# Patient Record
Sex: Female | Born: 1970 | Race: Black or African American | Hispanic: No | Marital: Married | Smoking: Never smoker
Health system: Southern US, Community
[De-identification: ages and names within clinical notes are randomized; demographics above are authoritative.]

---

## 2001-04-02 ENCOUNTER — Other Ambulatory Visit: Admission: RE | Admit: 2001-04-02 | Discharge: 2001-04-02 | Payer: Self-pay | Admitting: *Deleted

## 2001-04-03 ENCOUNTER — Other Ambulatory Visit: Admission: RE | Admit: 2001-04-03 | Discharge: 2001-04-03 | Payer: Self-pay | Admitting: *Deleted

## 2002-04-08 ENCOUNTER — Other Ambulatory Visit: Admission: RE | Admit: 2002-04-08 | Discharge: 2002-04-08 | Payer: Self-pay | Admitting: Obstetrics and Gynecology

## 2003-04-10 ENCOUNTER — Other Ambulatory Visit: Admission: RE | Admit: 2003-04-10 | Discharge: 2003-04-10 | Payer: Self-pay | Admitting: Obstetrics and Gynecology

## 2003-04-14 ENCOUNTER — Encounter: Admission: RE | Admit: 2003-04-14 | Discharge: 2003-04-14 | Payer: Self-pay | Admitting: Obstetrics and Gynecology

## 2003-09-19 ENCOUNTER — Ambulatory Visit (HOSPITAL_COMMUNITY): Admission: RE | Admit: 2003-09-19 | Discharge: 2003-09-19 | Payer: Self-pay | Admitting: Obstetrics and Gynecology

## 2003-11-15 ENCOUNTER — Inpatient Hospital Stay (HOSPITAL_COMMUNITY): Admission: AD | Admit: 2003-11-15 | Discharge: 2003-11-15 | Payer: Self-pay | Admitting: Obstetrics and Gynecology

## 2004-02-12 ENCOUNTER — Inpatient Hospital Stay (HOSPITAL_COMMUNITY): Admission: AD | Admit: 2004-02-12 | Discharge: 2004-02-14 | Payer: Self-pay | Admitting: Obstetrics and Gynecology

## 2004-05-03 ENCOUNTER — Other Ambulatory Visit: Admission: RE | Admit: 2004-05-03 | Discharge: 2004-05-03 | Payer: Self-pay | Admitting: Obstetrics and Gynecology

## 2005-07-14 ENCOUNTER — Other Ambulatory Visit: Admission: RE | Admit: 2005-07-14 | Discharge: 2005-07-14 | Payer: Self-pay | Admitting: Obstetrics and Gynecology

## 2009-01-27 ENCOUNTER — Encounter: Admission: RE | Admit: 2009-01-27 | Discharge: 2009-01-27 | Payer: Self-pay | Admitting: Obstetrics and Gynecology

## 2009-02-04 ENCOUNTER — Encounter: Admission: RE | Admit: 2009-02-04 | Discharge: 2009-02-04 | Payer: Self-pay | Admitting: Obstetrics and Gynecology

## 2010-02-16 ENCOUNTER — Other Ambulatory Visit: Payer: Self-pay | Admitting: Obstetrics and Gynecology

## 2010-02-23 ENCOUNTER — Ambulatory Visit
Admission: RE | Admit: 2010-02-23 | Discharge: 2010-02-23 | Disposition: A | Discharge status: Home / Self Care | Payer: BC Managed Care – PPO | Source: Ambulatory Visit | Attending: Obstetrics and Gynecology | Admitting: Obstetrics and Gynecology

## 2010-02-23 ENCOUNTER — Other Ambulatory Visit: Payer: Self-pay | Admitting: Obstetrics and Gynecology

## 2010-05-21 NOTE — H&P (Signed)
NAMEARRON, Payne                ACCOUNT NO.:  [id number]   MEDICAL RECORD NO.:  [id number]          PATIENT TYPE:  MAT   LOCATION:  [facility]                          FACILITY:  [facility]   PHYSICIAN:  Naima A. Dillard, M.D. DATE OF BIRTH:  [birth year]   DATE OF ADMISSION:  02/12/2004  DATE OF DISCHARGE:                                HISTORY & PHYSICAL   HISTORY OF PRESENT ILLNESS:  Ms. Gabriela Payne is a 40 year old single black female,  gravida 3, para 0-0-2-0, at 40 weeks, who presents with regular uterine  contractions for two hours.  She denies leaking, bleeding, headache, nausea  and vomiting, or visual disturbances.  Her pregnancy has  been followed by  the [facility] certified nurse midwife service and has been  remarkable for:  1) Advanced maternal age.  2) Questionable last menstrual  period.  3) First trimester spotting.  4) Latex allergy.  5) Polydactyly.  6) HSV 2.  7) Group B Strep positive.  The patient denies any HSV prodrome.  Her prenatal labs were collected on July 08, 2003; hemoglobin 12.3,  hematocrit 38.4, platelets 284,000. Blood type A positive, antibody  negative, sickle cell trait negative, RPR nonreactive, rubella immune,  hepatitis B surface antigen negative, HIV nonreactive, Pap smear within  normal limits, gonorrhea negative, Chlamydia negative, HSV positive.  Her  one-hour Glucola from November 13, 2003, was 99 and hemoglobin at that time  was 11.5.  Culture of the vaginal tract for Group B Strep on January 16, 2004, was positive.   HISTORY OF PRESENT PREGNANCY:  The patient presented for care at [facility] on July 08, 2003, at 8-1/[redacted] weeks gestation.  EDC at that time was  established by ultrasound as February 12, 2004.  The patient declined genetic  screening.  Pregnancy ultrasonography at [redacted] weeks gestation shows growth  consistent with previous dating, no anatomic abnormalities were noted.  Rest  of her prenatal care has been unremarkable.   PAST  OBSTETRICAL HISTORY:  She is a gravida 3, para 0-0-2-0.  In 1985, she  had a first trimester SAB with a D&C.  In 1989 she had a first trimester  EAB.   ALLERGIES:  She has a questionable allergy to COMPAZINE resulting in  swelling of the tongue.  She also has a LATEX allergy resulting in swelling.   PAST MEDICAL HISTORY:  She reports having had the usual childhood illnesses.  She was treated in 1988 for Chlamydia.  She was diagnosed with HSV 2  approximately two years ago.  She reports having a heart murmur not  requiring prophylaxis.  She had anemia as a child.   PAST SURGICAL HISTORY:  Negative.   FAMILY HISTORY:  Remarkable for maternal grandmother with chronic  hypertension.  Mother and sister with arthritis.  Maternal grandmother with  colon cancer.  Father with multiple addictions.   GENETIC HISTORY:  Remarkable for the patient at age 48 and also polydactyly.   SOCIAL HISTORY:  Father of the baby is involved and supportive.  His name is  Gabriela Payne.  They are of the  [employer] faith.  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  Father of the baby is a Psychiatric nurse.  They deny any  alcohol, tobacco, or illicit drug use with the pregnancy.   PHYSICAL EXAMINATION:  VITAL SIGNS:  Stable, she is afebrile.  HEENT:  Grossly within normal limits.  CHEST:  Clear to auscultation.  HEART:  Regular rate and rhythm.  ABDOMEN:  Gravid in contour, fundal height extending approximately 40 cm  above pubic symphysis.  Fetal heart rate is reactive and reassuring.  Contractions every three minutes.  PELVIC:  Cervix is 4 cm, 90%, and vertex at -2 with intact bag of waters.  Speculum examination shows no signs or symptoms of HSV lesions.  EXTREMITIES:  Normal.   ASSESSMENT:  1.  Intrauterine pregnancy at term.  2.  Early active labor.   PLAN:  1.  To admit to birthing suite.  Dr. Normand Sloop has been notified.  2.  Routine C.N.M. orders.  3.  Plan Penicillin-G for Group B Strep.  4.  The  patient plans epidural for labor.      KS/MEDQ  D:  02/12/2004  T:  02/12/2004  Job:  [id number]

## 2011-04-29 ENCOUNTER — Other Ambulatory Visit: Payer: Self-pay | Admitting: Obstetrics and Gynecology

## 2011-04-29 DIAGNOSIS — Z1231 Encounter for screening mammogram for malignant neoplasm of breast: Secondary | ICD-10-CM

## 2011-05-11 ENCOUNTER — Ambulatory Visit
Admission: RE | Admit: 2011-05-11 | Discharge: 2011-05-11 | Disposition: A | Discharge status: Home / Self Care | Payer: BC Managed Care – PPO | Source: Ambulatory Visit | Attending: Obstetrics and Gynecology | Admitting: Obstetrics and Gynecology

## 2011-05-11 DIAGNOSIS — Z1231 Encounter for screening mammogram for malignant neoplasm of breast: Secondary | ICD-10-CM

## 2011-06-07 ENCOUNTER — Encounter: Payer: Self-pay | Admitting: Obstetrics and Gynecology

## 2012-10-12 ENCOUNTER — Other Ambulatory Visit: Payer: Self-pay

## 2012-10-12 DIAGNOSIS — Z1231 Encounter for screening mammogram for malignant neoplasm of breast: Secondary | ICD-10-CM

## 2012-10-30 ENCOUNTER — Ambulatory Visit
Admission: RE | Admit: 2012-10-30 | Discharge: 2012-10-30 | Disposition: A | Discharge status: Home / Self Care | Payer: BC Managed Care – PPO | Source: Ambulatory Visit

## 2012-10-30 DIAGNOSIS — Z1231 Encounter for screening mammogram for malignant neoplasm of breast: Secondary | ICD-10-CM

## 2013-10-21 ENCOUNTER — Other Ambulatory Visit: Payer: Self-pay

## 2013-10-21 DIAGNOSIS — Z1239 Encounter for other screening for malignant neoplasm of breast: Secondary | ICD-10-CM

## 2013-10-24 ENCOUNTER — Other Ambulatory Visit: Payer: Self-pay

## 2013-10-24 DIAGNOSIS — Z1231 Encounter for screening mammogram for malignant neoplasm of breast: Secondary | ICD-10-CM

## 2013-11-01 ENCOUNTER — Ambulatory Visit
Admission: RE | Admit: 2013-11-01 | Discharge: 2013-11-01 | Disposition: A | Discharge status: Home / Self Care | Payer: BC Managed Care – PPO | Source: Ambulatory Visit

## 2013-11-01 DIAGNOSIS — Z1231 Encounter for screening mammogram for malignant neoplasm of breast: Secondary | ICD-10-CM

## 2013-11-05 ENCOUNTER — Other Ambulatory Visit: Payer: Self-pay | Admitting: Obstetrics and Gynecology

## 2013-11-05 DIAGNOSIS — R928 Other abnormal and inconclusive findings on diagnostic imaging of breast: Secondary | ICD-10-CM

## 2013-11-18 ENCOUNTER — Ambulatory Visit
Admission: RE | Admit: 2013-11-18 | Discharge: 2013-11-18 | Disposition: A | Discharge status: Home / Self Care | Payer: BC Managed Care – PPO | Source: Ambulatory Visit | Attending: Obstetrics and Gynecology | Admitting: Obstetrics and Gynecology

## 2013-11-18 DIAGNOSIS — R928 Other abnormal and inconclusive findings on diagnostic imaging of breast: Secondary | ICD-10-CM

## 2015-02-03 ENCOUNTER — Other Ambulatory Visit: Payer: Self-pay

## 2015-02-03 DIAGNOSIS — Z1231 Encounter for screening mammogram for malignant neoplasm of breast: Secondary | ICD-10-CM

## 2015-02-13 ENCOUNTER — Ambulatory Visit
Admission: RE | Admit: 2015-02-13 | Discharge: 2015-02-13 | Disposition: A | Discharge status: Home / Self Care | Payer: BC Managed Care – PPO | Source: Ambulatory Visit

## 2015-02-13 DIAGNOSIS — Z1231 Encounter for screening mammogram for malignant neoplasm of breast: Secondary | ICD-10-CM

## 2015-02-17 ENCOUNTER — Other Ambulatory Visit: Payer: Self-pay | Admitting: Obstetrics and Gynecology

## 2015-02-17 DIAGNOSIS — R928 Other abnormal and inconclusive findings on diagnostic imaging of breast: Secondary | ICD-10-CM

## 2015-02-23 ENCOUNTER — Ambulatory Visit
Admission: RE | Admit: 2015-02-23 | Discharge: 2015-02-23 | Disposition: A | Discharge status: Home / Self Care | Payer: BC Managed Care – PPO | Source: Ambulatory Visit | Attending: Obstetrics and Gynecology | Admitting: Obstetrics and Gynecology

## 2015-02-23 DIAGNOSIS — R928 Other abnormal and inconclusive findings on diagnostic imaging of breast: Secondary | ICD-10-CM

## 2016-04-13 ENCOUNTER — Other Ambulatory Visit: Payer: Self-pay | Admitting: Obstetrics and Gynecology

## 2016-04-13 DIAGNOSIS — Z1231 Encounter for screening mammogram for malignant neoplasm of breast: Secondary | ICD-10-CM

## 2016-05-04 ENCOUNTER — Ambulatory Visit
Admission: RE | Admit: 2016-05-04 | Discharge: 2016-05-04 | Disposition: A | Discharge status: Home / Self Care | Payer: BC Managed Care – PPO | Source: Ambulatory Visit | Attending: Obstetrics and Gynecology | Admitting: Obstetrics and Gynecology

## 2016-05-04 DIAGNOSIS — Z1231 Encounter for screening mammogram for malignant neoplasm of breast: Secondary | ICD-10-CM

## 2016-05-05 ENCOUNTER — Other Ambulatory Visit: Payer: Self-pay | Admitting: Obstetrics and Gynecology

## 2016-05-05 DIAGNOSIS — R928 Other abnormal and inconclusive findings on diagnostic imaging of breast: Secondary | ICD-10-CM

## 2016-05-09 ENCOUNTER — Ambulatory Visit
Admission: RE | Admit: 2016-05-09 | Discharge: 2016-05-09 | Disposition: A | Discharge status: Home / Self Care | Payer: BC Managed Care – PPO | Source: Ambulatory Visit | Attending: Obstetrics and Gynecology | Admitting: Obstetrics and Gynecology

## 2016-05-09 DIAGNOSIS — R928 Other abnormal and inconclusive findings on diagnostic imaging of breast: Secondary | ICD-10-CM

## 2017-08-10 ENCOUNTER — Other Ambulatory Visit: Payer: Self-pay | Admitting: Obstetrics and Gynecology

## 2017-08-10 DIAGNOSIS — Z1231 Encounter for screening mammogram for malignant neoplasm of breast: Secondary | ICD-10-CM

## 2017-09-05 ENCOUNTER — Ambulatory Visit
Admission: RE | Admit: 2017-09-05 | Discharge: 2017-09-05 | Disposition: A | Discharge status: Home / Self Care | Payer: BC Managed Care – PPO | Source: Ambulatory Visit | Attending: Obstetrics and Gynecology | Admitting: Obstetrics and Gynecology

## 2017-09-05 DIAGNOSIS — Z1231 Encounter for screening mammogram for malignant neoplasm of breast: Secondary | ICD-10-CM

## 2017-09-06 ENCOUNTER — Other Ambulatory Visit: Payer: Self-pay | Admitting: Obstetrics and Gynecology

## 2017-09-06 DIAGNOSIS — R928 Other abnormal and inconclusive findings on diagnostic imaging of breast: Secondary | ICD-10-CM

## 2017-09-13 ENCOUNTER — Ambulatory Visit
Admission: RE | Admit: 2017-09-13 | Discharge: 2017-09-13 | Disposition: A | Discharge status: Home / Self Care | Payer: BC Managed Care – PPO | Source: Ambulatory Visit | Attending: Obstetrics and Gynecology | Admitting: Obstetrics and Gynecology

## 2017-09-13 DIAGNOSIS — R928 Other abnormal and inconclusive findings on diagnostic imaging of breast: Secondary | ICD-10-CM

## 2018-09-21 ENCOUNTER — Other Ambulatory Visit: Payer: Self-pay | Admitting: Obstetrics and Gynecology

## 2018-09-21 DIAGNOSIS — Z1231 Encounter for screening mammogram for malignant neoplasm of breast: Secondary | ICD-10-CM

## 2018-11-09 ENCOUNTER — Other Ambulatory Visit: Payer: Self-pay

## 2018-11-09 ENCOUNTER — Ambulatory Visit
Admission: RE | Admit: 2018-11-09 | Discharge: 2018-11-09 | Disposition: A | Discharge status: Home / Self Care | Payer: BC Managed Care – PPO | Source: Ambulatory Visit | Attending: Obstetrics and Gynecology | Admitting: Obstetrics and Gynecology

## 2018-11-09 DIAGNOSIS — Z1231 Encounter for screening mammogram for malignant neoplasm of breast: Secondary | ICD-10-CM

## 2019-11-05 ENCOUNTER — Other Ambulatory Visit: Payer: Self-pay | Admitting: Family Medicine

## 2019-11-05 DIAGNOSIS — Z1231 Encounter for screening mammogram for malignant neoplasm of breast: Secondary | ICD-10-CM

## 2019-12-03 ENCOUNTER — Ambulatory Visit
Admission: RE | Admit: 2019-12-03 | Discharge: 2019-12-03 | Disposition: A | Discharge status: Home / Self Care | Payer: BC Managed Care – PPO | Source: Ambulatory Visit | Attending: Family Medicine | Admitting: Family Medicine

## 2019-12-03 ENCOUNTER — Other Ambulatory Visit: Payer: Self-pay

## 2019-12-03 DIAGNOSIS — Z1231 Encounter for screening mammogram for malignant neoplasm of breast: Secondary | ICD-10-CM

## 2019-12-13 ENCOUNTER — Ambulatory Visit: Payer: BC Managed Care – PPO

## 2020-11-20 ENCOUNTER — Other Ambulatory Visit: Payer: Self-pay | Admitting: Family Medicine

## 2020-11-20 DIAGNOSIS — Z1231 Encounter for screening mammogram for malignant neoplasm of breast: Secondary | ICD-10-CM

## 2020-12-22 ENCOUNTER — Ambulatory Visit
Admission: RE | Admit: 2020-12-22 | Discharge: 2020-12-22 | Disposition: A | Discharge status: Home / Self Care | Payer: BC Managed Care – PPO | Source: Ambulatory Visit | Attending: Family Medicine | Admitting: Family Medicine

## 2020-12-22 DIAGNOSIS — Z1231 Encounter for screening mammogram for malignant neoplasm of breast: Secondary | ICD-10-CM

## 2020-12-23 ENCOUNTER — Ambulatory Visit: Payer: BC Managed Care – PPO

## 2021-08-24 ENCOUNTER — Other Ambulatory Visit: Payer: Self-pay

## 2021-08-24 ENCOUNTER — Ambulatory Visit: Payer: BC Managed Care – PPO | Attending: Family Medicine | Admitting: Physical Therapy

## 2021-08-24 ENCOUNTER — Encounter: Payer: Self-pay | Admitting: Physical Therapy

## 2021-08-24 DIAGNOSIS — M6281 Muscle weakness (generalized): Secondary | ICD-10-CM | POA: Insufficient documentation

## 2021-08-24 DIAGNOSIS — R252 Cramp and spasm: Secondary | ICD-10-CM | POA: Diagnosis present
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
## 2021-08-31 ENCOUNTER — Encounter: Payer: Self-pay | Admitting: Physical Therapy

## 2021-08-31 ENCOUNTER — Ambulatory Visit: Payer: BC Managed Care – PPO | Admitting: Physical Therapy

## 2021-08-31 DIAGNOSIS — M6281 Muscle weakness (generalized): Secondary | ICD-10-CM

## 2021-08-31 DIAGNOSIS — R252 Cramp and spasm: Secondary | ICD-10-CM
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
## 2021-09-08 ENCOUNTER — Encounter: Payer: Self-pay | Admitting: Physical Therapy

## 2021-09-08 ENCOUNTER — Ambulatory Visit: Payer: BC Managed Care – PPO | Attending: Family Medicine | Admitting: Physical Therapy

## 2021-09-08 DIAGNOSIS — M6281 Muscle weakness (generalized): Secondary | ICD-10-CM | POA: Diagnosis present

## 2021-09-08 DIAGNOSIS — R252 Cramp and spasm: Secondary | ICD-10-CM | POA: Diagnosis present
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
## 2021-09-16 ENCOUNTER — Ambulatory Visit: Payer: BC Managed Care – PPO | Admitting: Physical Therapy

## 2021-09-16 ENCOUNTER — Encounter: Payer: Self-pay | Admitting: Physical Therapy

## 2021-09-16 DIAGNOSIS — M6281 Muscle weakness (generalized): Secondary | ICD-10-CM | POA: Diagnosis not present

## 2021-09-16 DIAGNOSIS — R252 Cramp and spasm: Secondary | ICD-10-CM
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
## 2021-09-23 ENCOUNTER — Ambulatory Visit: Payer: BC Managed Care – PPO | Admitting: Physical Therapy

## 2021-09-23 ENCOUNTER — Encounter: Payer: Self-pay | Admitting: Physical Therapy

## 2021-09-23 DIAGNOSIS — M6281 Muscle weakness (generalized): Secondary | ICD-10-CM

## 2021-09-23 DIAGNOSIS — R252 Cramp and spasm: Secondary | ICD-10-CM

## 2021-09-28 ENCOUNTER — Ambulatory Visit: Payer: BC Managed Care – PPO | Admitting: Physical Therapy

## 2021-10-05 ENCOUNTER — Encounter: Payer: BC Managed Care – PPO | Admitting: Physical Therapy

## 2021-10-20 ENCOUNTER — Ambulatory Visit: Payer: BC Managed Care – PPO | Attending: Family Medicine | Admitting: Physical Therapy

## 2021-10-20 DIAGNOSIS — R252 Cramp and spasm: Secondary | ICD-10-CM | POA: Insufficient documentation

## 2021-10-20 DIAGNOSIS — M6281 Muscle weakness (generalized): Secondary | ICD-10-CM | POA: Insufficient documentation
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
## 2021-10-28 ENCOUNTER — Encounter: Payer: Self-pay | Admitting: Physical Therapy

## 2021-10-28 ENCOUNTER — Ambulatory Visit: Payer: BC Managed Care – PPO | Admitting: Physical Therapy

## 2021-10-28 DIAGNOSIS — M6281 Muscle weakness (generalized): Secondary | ICD-10-CM | POA: Diagnosis not present

## 2021-10-28 DIAGNOSIS — R252 Cramp and spasm: Secondary | ICD-10-CM
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
## 2021-11-04 ENCOUNTER — Ambulatory Visit: Payer: BC Managed Care – PPO | Attending: Family Medicine | Admitting: Physical Therapy

## 2021-11-04 DIAGNOSIS — M6281 Muscle weakness (generalized): Secondary | ICD-10-CM | POA: Diagnosis not present

## 2021-11-04 DIAGNOSIS — R252 Cramp and spasm: Secondary | ICD-10-CM | POA: Diagnosis present
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
## 2021-11-11 ENCOUNTER — Ambulatory Visit: Payer: BC Managed Care – PPO | Admitting: Physical Therapy

## 2021-11-11 ENCOUNTER — Encounter: Payer: Self-pay | Admitting: Physical Therapy

## 2021-11-11 DIAGNOSIS — M6281 Muscle weakness (generalized): Secondary | ICD-10-CM | POA: Diagnosis not present

## 2021-11-11 DIAGNOSIS — R252 Cramp and spasm: Secondary | ICD-10-CM
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
## 2021-11-16 ENCOUNTER — Ambulatory Visit: Payer: BC Managed Care – PPO | Admitting: Physical Therapy

## 2021-11-16 ENCOUNTER — Encounter: Payer: Self-pay | Admitting: Physical Therapy

## 2021-11-16 DIAGNOSIS — M6281 Muscle weakness (generalized): Secondary | ICD-10-CM

## 2021-11-16 DIAGNOSIS — R252 Cramp and spasm: Secondary | ICD-10-CM
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
## 2022-01-14 ENCOUNTER — Other Ambulatory Visit: Payer: Self-pay | Admitting: Obstetrics and Gynecology

## 2022-01-14 DIAGNOSIS — Z1231 Encounter for screening mammogram for malignant neoplasm of breast: Secondary | ICD-10-CM

## 2022-01-18 ENCOUNTER — Ambulatory Visit
Admission: RE | Admit: 2022-01-18 | Discharge: 2022-01-18 | Disposition: A | Discharge status: Home / Self Care | Payer: BC Managed Care – PPO | Source: Ambulatory Visit | Attending: Obstetrics and Gynecology | Admitting: Obstetrics and Gynecology

## 2022-01-18 DIAGNOSIS — Z1231 Encounter for screening mammogram for malignant neoplasm of breast: Secondary | ICD-10-CM

## 2022-12-21 ENCOUNTER — Other Ambulatory Visit: Payer: Self-pay | Admitting: Obstetrics and Gynecology

## 2022-12-21 DIAGNOSIS — Z1231 Encounter for screening mammogram for malignant neoplasm of breast: Secondary | ICD-10-CM

## 2023-01-26 ENCOUNTER — Ambulatory Visit
Admission: RE | Admit: 2023-01-26 | Discharge: 2023-01-26 | Disposition: A | Discharge status: Home / Self Care | Payer: 59 | Source: Ambulatory Visit | Attending: Obstetrics and Gynecology | Admitting: Obstetrics and Gynecology

## 2023-01-26 DIAGNOSIS — Z1231 Encounter for screening mammogram for malignant neoplasm of breast: Secondary | ICD-10-CM

## 2023-02-09 ENCOUNTER — Other Ambulatory Visit: Payer: Self-pay | Admitting: Medical Genetics

## 2023-06-28 ENCOUNTER — Other Ambulatory Visit (HOSPITAL_COMMUNITY)
Admission: RE | Admit: 2023-06-28 | Discharge: 2023-06-28 | Disposition: A | Discharge status: Home / Self Care | Payer: Self-pay | Source: Ambulatory Visit | Attending: Medical Genetics | Admitting: Medical Genetics

## 2023-07-09 LAB — GENECONNECT MOLECULAR SCREEN: Genetic Analysis Overall Interpretation: NEGATIVE

## 2023-11-07 IMAGING — MG MM DIGITAL SCREENING BILAT W/ TOMO AND CAD
8 series · 9 of 24 positions shown · non-contrast
Comparison: Previous exam(s).

CLINICAL DATA: Screening.

EXAM:
DIGITAL SCREENING BILATERAL MAMMOGRAM WITH TOMOSYNTHESIS AND CAD
TECHNIQUE: Bilateral screening digital craniocaudal and mediolateral oblique
mammograms were obtained. Bilateral screening digital breast
tomosynthesis was performed. The images were evaluated with
computer-aided detection.

[L CC synth-2D]
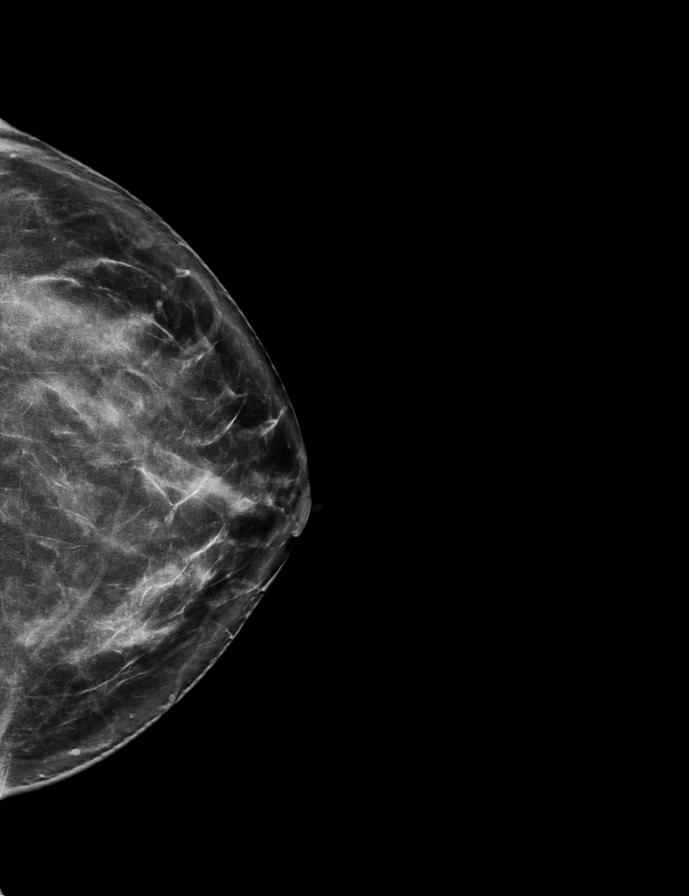

[R MLO synth-2D]
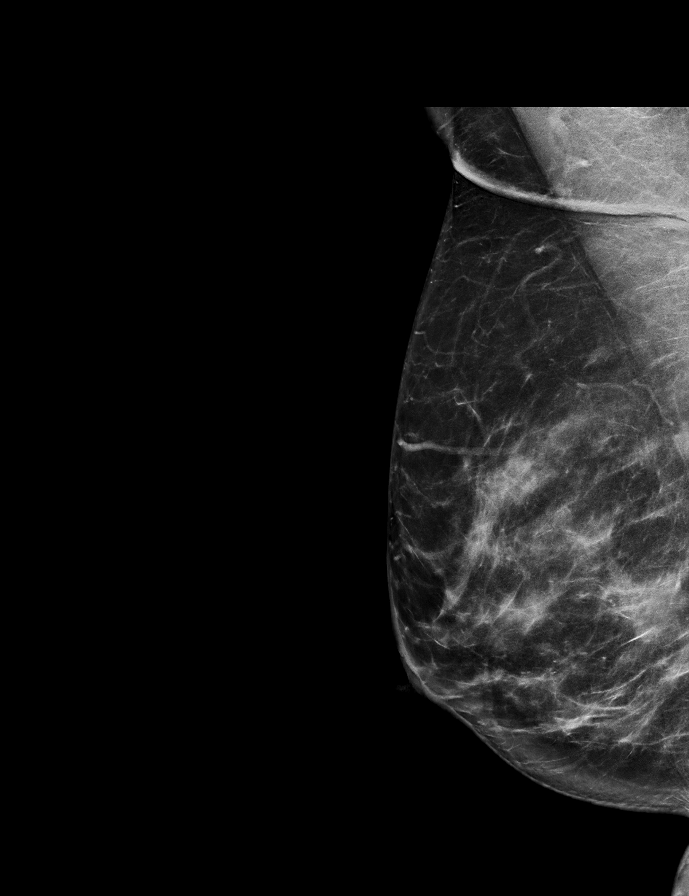

[R CC synth-2D]
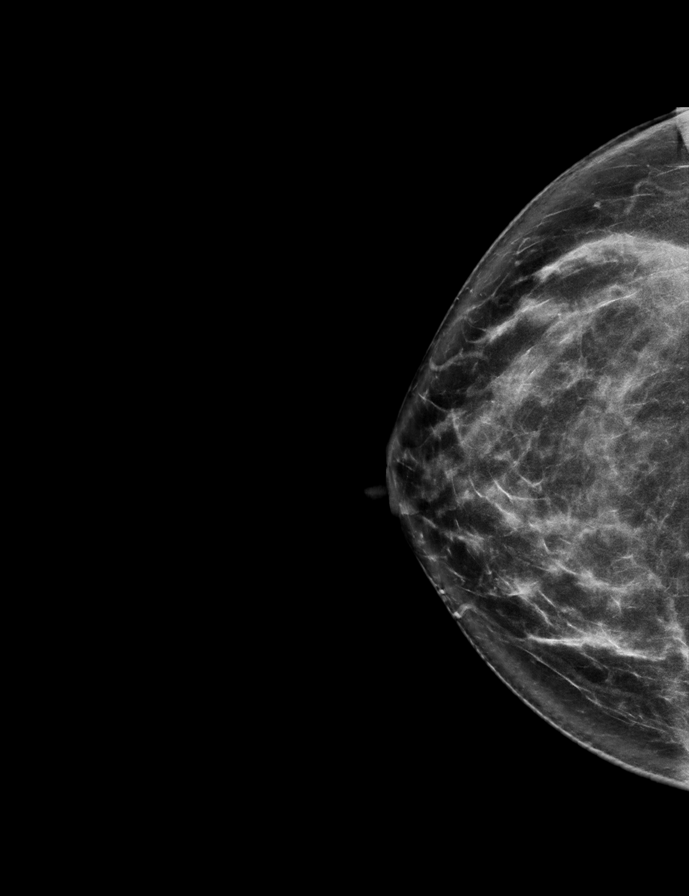

[L MLO synth-2D]
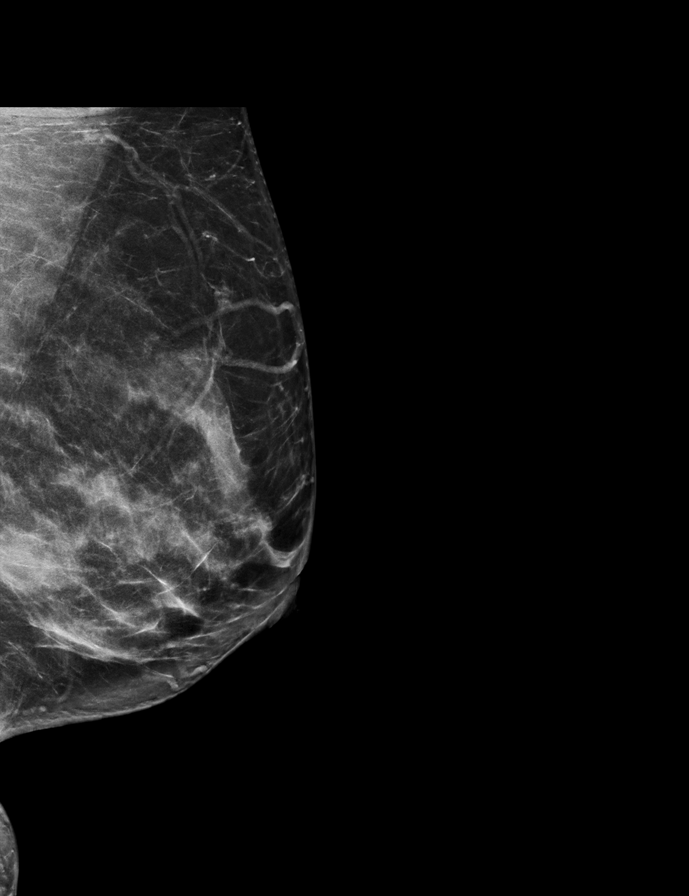

[L MLO tomo · 2 of 70 frames shown]
[frame 23/70]
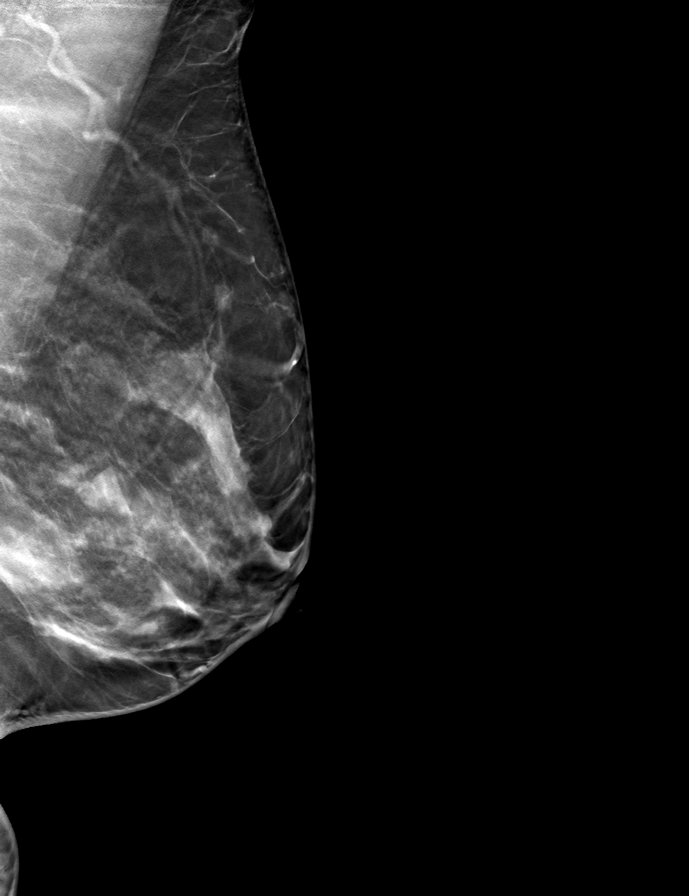
[frame 35/70]
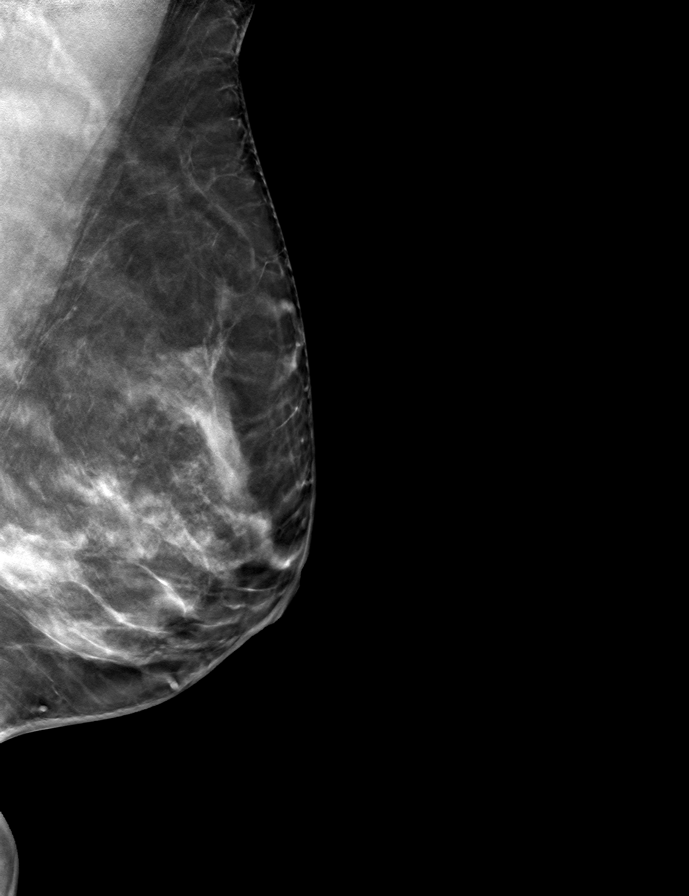

[R MLO tomo · tomo slice 37/72.0]
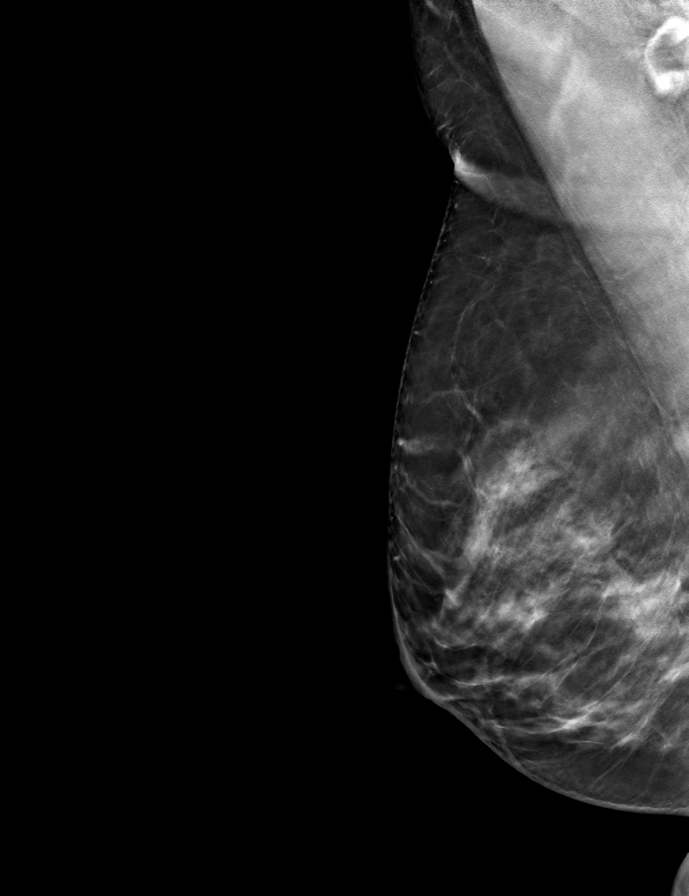

[L CC tomo · tomo slice 38/75.0]
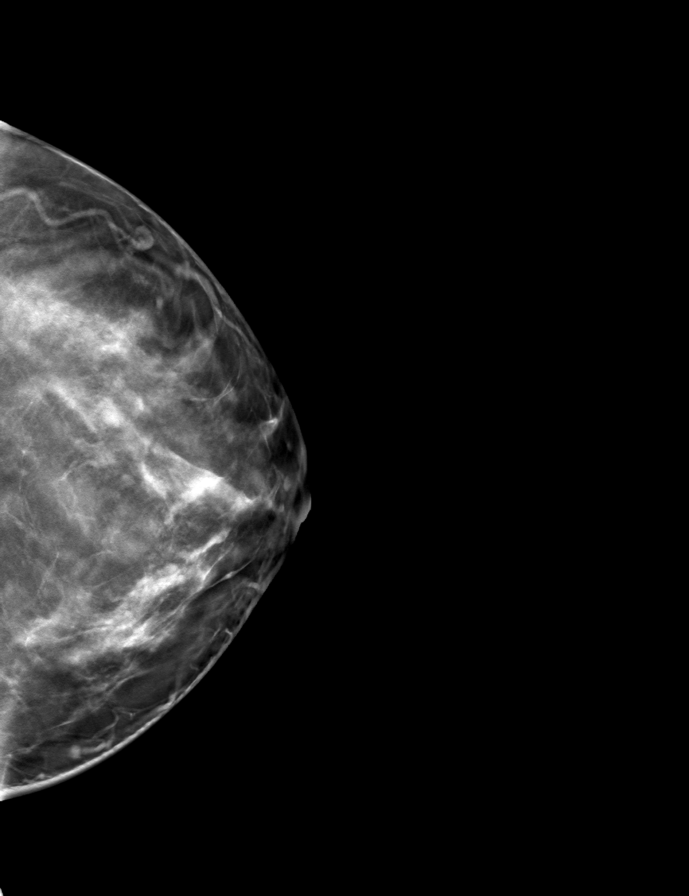

[R CC tomo · tomo slice 37/73.0]
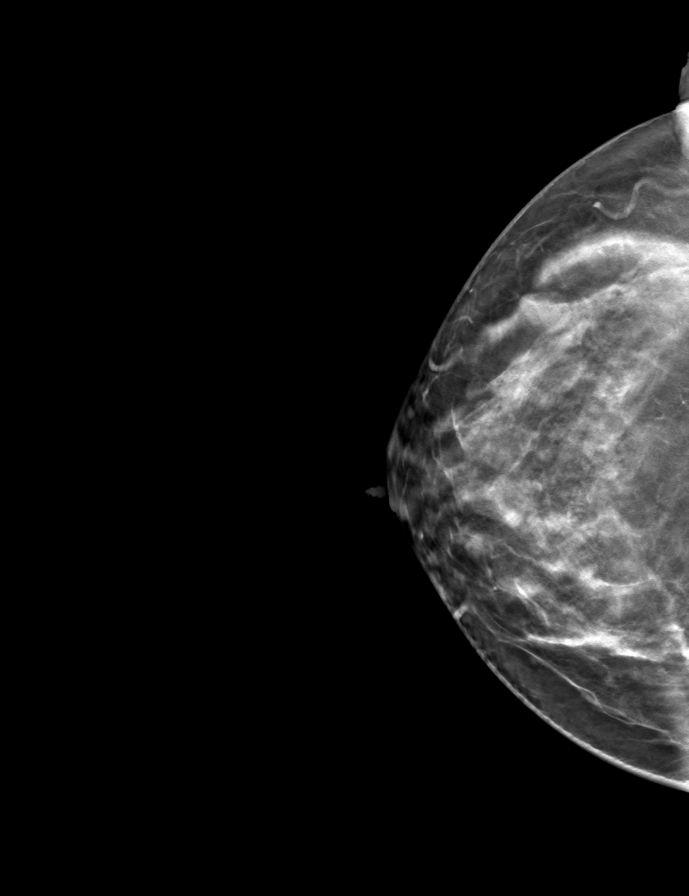

[9 of 24 positions shown; findings below may reference images not displayed]

ACR Breast Density Category c: The breast tissue is heterogeneously
dense, which may obscure small masses.
FINDINGS: There are no findings suspicious for malignancy.
IMPRESSION: No mammographic evidence of malignancy. A result letter of this
screening mammogram will be mailed directly to the patient.

RECOMMENDATION:
Screening mammogram in one year. (Code:[id number]-[id number])

BI-RADS CATEGORY  1: Negative.

## 2023-12-26 ENCOUNTER — Other Ambulatory Visit: Payer: Self-pay | Admitting: Obstetrics and Gynecology

## 2023-12-26 DIAGNOSIS — Z1231 Encounter for screening mammogram for malignant neoplasm of breast: Secondary | ICD-10-CM

## 2024-01-29 ENCOUNTER — Ambulatory Visit

## 2024-02-06 ENCOUNTER — Ambulatory Visit

## 2024-02-14 ENCOUNTER — Ambulatory Visit
